# Patient Record
Sex: Female | Born: 1945
Health system: Southern US, Community
[De-identification: ages and names within clinical notes are randomized; demographics above are authoritative.]

---

## 2000-01-13 ENCOUNTER — Other Ambulatory Visit: Admission: RE | Admit: 2000-01-13 | Discharge: 2000-01-13 | Payer: Self-pay | Admitting: Obstetrics and Gynecology

## 2000-02-03 ENCOUNTER — Other Ambulatory Visit: Admission: RE | Admit: 2000-02-03 | Discharge: 2000-02-03 | Payer: Self-pay | Admitting: Obstetrics and Gynecology

## 2000-02-03 ENCOUNTER — Encounter (INDEPENDENT_AMBULATORY_CARE_PROVIDER_SITE_OTHER): Payer: Self-pay

## 2001-05-08 ENCOUNTER — Other Ambulatory Visit: Admission: RE | Admit: 2001-05-08 | Discharge: 2001-05-08 | Payer: Self-pay | Admitting: Obstetrics and Gynecology

## 2001-05-08 ENCOUNTER — Encounter: Payer: Self-pay | Admitting: Internal Medicine

## 2001-05-08 ENCOUNTER — Ambulatory Visit (HOSPITAL_COMMUNITY): Admission: RE | Admit: 2001-05-08 | Discharge: 2001-05-08 | Payer: Self-pay | Admitting: Internal Medicine

## 2008-07-09 ENCOUNTER — Ambulatory Visit: Payer: Self-pay | Admitting: Internal Medicine

## 2008-07-21 ENCOUNTER — Ambulatory Visit: Payer: Self-pay | Admitting: Internal Medicine

## 2009-07-21 ENCOUNTER — Ambulatory Visit (HOSPITAL_COMMUNITY): Admission: RE | Admit: 2009-07-21 | Discharge: 2009-07-21 | Payer: Self-pay | Admitting: Internal Medicine

## 2011-03-29 ENCOUNTER — Other Ambulatory Visit: Payer: Self-pay | Admitting: Internal Medicine

## 2011-03-29 DIAGNOSIS — Z1231 Encounter for screening mammogram for malignant neoplasm of breast: Secondary | ICD-10-CM

## 2011-04-19 ENCOUNTER — Ambulatory Visit: Payer: Self-pay

## 2014-06-05 DIAGNOSIS — Z1231 Encounter for screening mammogram for malignant neoplasm of breast: Secondary | ICD-10-CM | POA: Diagnosis not present

## 2014-12-03 DIAGNOSIS — Z0000 Encounter for general adult medical examination without abnormal findings: Secondary | ICD-10-CM | POA: Diagnosis not present

## 2014-12-03 DIAGNOSIS — R5383 Other fatigue: Secondary | ICD-10-CM | POA: Diagnosis not present

## 2014-12-03 DIAGNOSIS — R413 Other amnesia: Secondary | ICD-10-CM | POA: Diagnosis not present

## 2014-12-03 DIAGNOSIS — Z23 Encounter for immunization: Secondary | ICD-10-CM | POA: Diagnosis not present

## 2014-12-11 ENCOUNTER — Other Ambulatory Visit: Payer: Self-pay | Admitting: Internal Medicine

## 2014-12-11 ENCOUNTER — Other Ambulatory Visit: Payer: Self-pay | Admitting: Nurse Practitioner

## 2014-12-11 DIAGNOSIS — E049 Nontoxic goiter, unspecified: Secondary | ICD-10-CM

## 2014-12-15 ENCOUNTER — Other Ambulatory Visit: Payer: Self-pay | Admitting: Nurse Practitioner

## 2014-12-15 DIAGNOSIS — E049 Nontoxic goiter, unspecified: Secondary | ICD-10-CM

## 2014-12-16 ENCOUNTER — Ambulatory Visit
Admission: RE | Admit: 2014-12-16 | Discharge: 2014-12-16 | Disposition: A | Discharge status: Home / Self Care | Payer: Medicare Other | Source: Ambulatory Visit | Attending: Internal Medicine | Admitting: Internal Medicine

## 2014-12-16 DIAGNOSIS — E049 Nontoxic goiter, unspecified: Secondary | ICD-10-CM

## 2014-12-16 DIAGNOSIS — E042 Nontoxic multinodular goiter: Secondary | ICD-10-CM | POA: Diagnosis not present

## 2015-03-23 DIAGNOSIS — Z23 Encounter for immunization: Secondary | ICD-10-CM | POA: Diagnosis not present

## 2015-04-13 DIAGNOSIS — E049 Nontoxic goiter, unspecified: Secondary | ICD-10-CM | POA: Diagnosis not present

## 2015-04-14 ENCOUNTER — Other Ambulatory Visit: Payer: Self-pay | Admitting: Endocrinology

## 2015-04-14 DIAGNOSIS — E041 Nontoxic single thyroid nodule: Secondary | ICD-10-CM

## 2015-05-21 DIAGNOSIS — H25013 Cortical age-related cataract, bilateral: Secondary | ICD-10-CM | POA: Diagnosis not present

## 2015-06-04 ENCOUNTER — Other Ambulatory Visit (HOSPITAL_COMMUNITY)
Admission: RE | Admit: 2015-06-04 | Discharge: 2015-06-04 | Disposition: A | Discharge status: Home / Self Care | Payer: Medicare Other | Source: Ambulatory Visit | Attending: Interventional Radiology | Admitting: Interventional Radiology

## 2015-06-04 ENCOUNTER — Other Ambulatory Visit: Payer: Medicare Other

## 2015-06-04 ENCOUNTER — Ambulatory Visit
Admission: RE | Admit: 2015-06-04 | Discharge: 2015-06-04 | Disposition: A | Discharge status: Home / Self Care | Payer: Medicare Other | Source: Ambulatory Visit | Attending: Endocrinology | Admitting: Endocrinology

## 2015-06-04 DIAGNOSIS — E041 Nontoxic single thyroid nodule: Secondary | ICD-10-CM

## 2015-06-04 DIAGNOSIS — E042 Nontoxic multinodular goiter: Secondary | ICD-10-CM | POA: Insufficient documentation

## 2015-07-24 ENCOUNTER — Encounter: Payer: Self-pay | Admitting: Gastroenterology

## 2015-09-14 ENCOUNTER — Encounter: Payer: Self-pay | Admitting: Internal Medicine

## 2015-10-05 DIAGNOSIS — E049 Nontoxic goiter, unspecified: Secondary | ICD-10-CM | POA: Diagnosis not present

## 2015-10-12 DIAGNOSIS — E049 Nontoxic goiter, unspecified: Secondary | ICD-10-CM | POA: Diagnosis not present

## 2016-02-11 DIAGNOSIS — Z23 Encounter for immunization: Secondary | ICD-10-CM | POA: Diagnosis not present

## 2016-09-08 DIAGNOSIS — Z6823 Body mass index (BMI) 23.0-23.9, adult: Secondary | ICD-10-CM | POA: Diagnosis not present

## 2016-09-08 DIAGNOSIS — Z0000 Encounter for general adult medical examination without abnormal findings: Secondary | ICD-10-CM | POA: Diagnosis not present

## 2016-09-08 DIAGNOSIS — R69 Illness, unspecified: Secondary | ICD-10-CM | POA: Diagnosis not present

## 2016-09-08 DIAGNOSIS — E049 Nontoxic goiter, unspecified: Secondary | ICD-10-CM | POA: Diagnosis not present

## 2016-10-11 ENCOUNTER — Other Ambulatory Visit: Payer: Self-pay | Admitting: Endocrinology

## 2016-10-11 DIAGNOSIS — E049 Nontoxic goiter, unspecified: Secondary | ICD-10-CM | POA: Diagnosis not present

## 2016-11-18 ENCOUNTER — Other Ambulatory Visit: Payer: Medicare Other

## 2016-11-21 ENCOUNTER — Other Ambulatory Visit: Payer: Self-pay | Admitting: Internal Medicine

## 2016-11-21 DIAGNOSIS — R413 Other amnesia: Secondary | ICD-10-CM | POA: Diagnosis not present

## 2016-11-21 DIAGNOSIS — Z1231 Encounter for screening mammogram for malignant neoplasm of breast: Secondary | ICD-10-CM

## 2016-11-21 DIAGNOSIS — Z0000 Encounter for general adult medical examination without abnormal findings: Secondary | ICD-10-CM | POA: Diagnosis not present

## 2016-11-21 DIAGNOSIS — Z1211 Encounter for screening for malignant neoplasm of colon: Secondary | ICD-10-CM | POA: Diagnosis not present

## 2016-11-21 DIAGNOSIS — Z1389 Encounter for screening for other disorder: Secondary | ICD-10-CM | POA: Diagnosis not present

## 2016-11-22 ENCOUNTER — Ambulatory Visit
Admission: RE | Admit: 2016-11-22 | Discharge: 2016-11-22 | Disposition: A | Discharge status: Home / Self Care | Payer: Medicare HMO | Source: Ambulatory Visit | Attending: Internal Medicine | Admitting: Internal Medicine

## 2016-11-22 ENCOUNTER — Ambulatory Visit
Admission: RE | Admit: 2016-11-22 | Discharge: 2016-11-22 | Disposition: A | Discharge status: Home / Self Care | Payer: Medicare HMO | Source: Ambulatory Visit | Attending: Endocrinology | Admitting: Endocrinology

## 2016-11-22 DIAGNOSIS — E042 Nontoxic multinodular goiter: Secondary | ICD-10-CM | POA: Diagnosis not present

## 2016-11-22 DIAGNOSIS — Z1231 Encounter for screening mammogram for malignant neoplasm of breast: Secondary | ICD-10-CM | POA: Diagnosis not present

## 2016-11-22 DIAGNOSIS — E049 Nontoxic goiter, unspecified: Secondary | ICD-10-CM

## 2016-11-24 IMAGING — US US THYROID BIOPSY
1 series · 13 of 24 positions shown · non-contrast
Comparison: None.

CLINICAL DATA: Bilateral lower pole dominant thyroid nodules

EXAM:
ULTRASOUND GUIDED NEEDLE ASPIRATE BIOPSY OF THE THYROID GLAND

[Series 1: us thyroid biopsy · 0.07mm/px · 24 acquisitions, 13 frames shown]
[im 1/24]
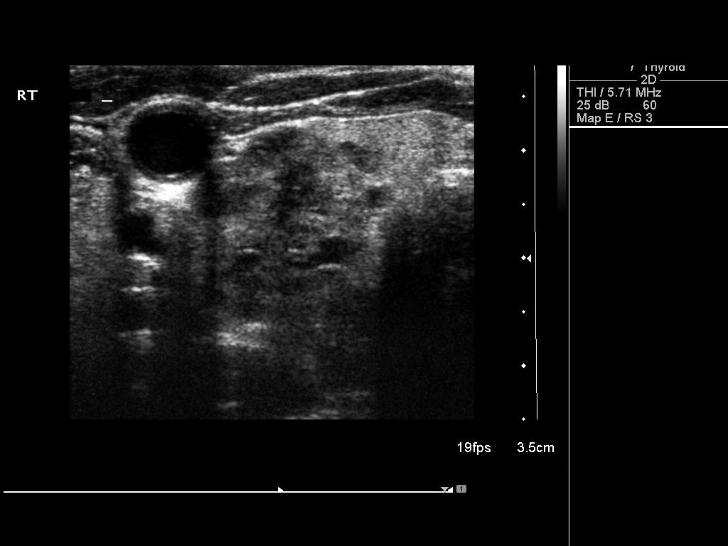
[im 3/24]
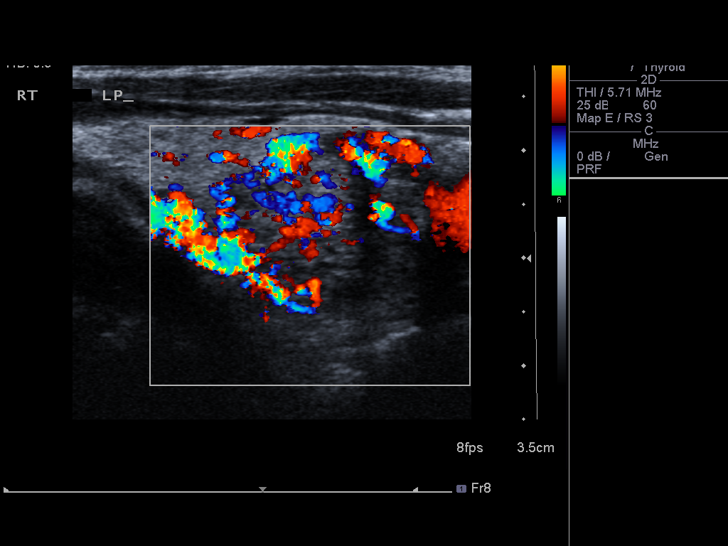
[im 5/24]
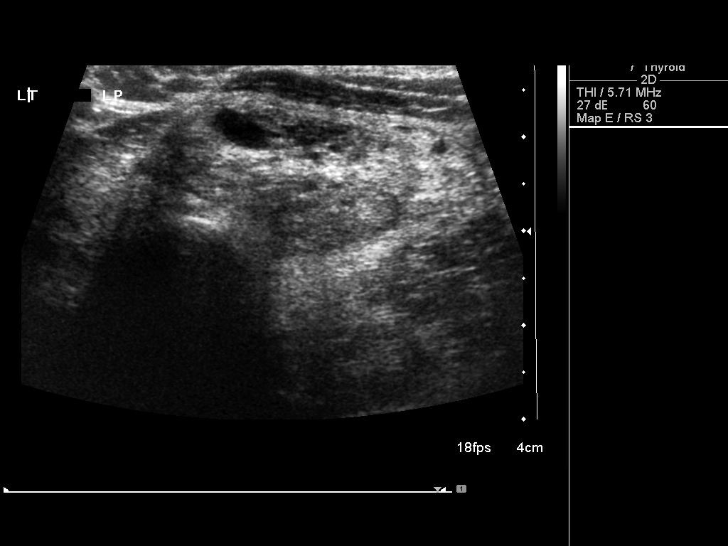
[im 7/24]
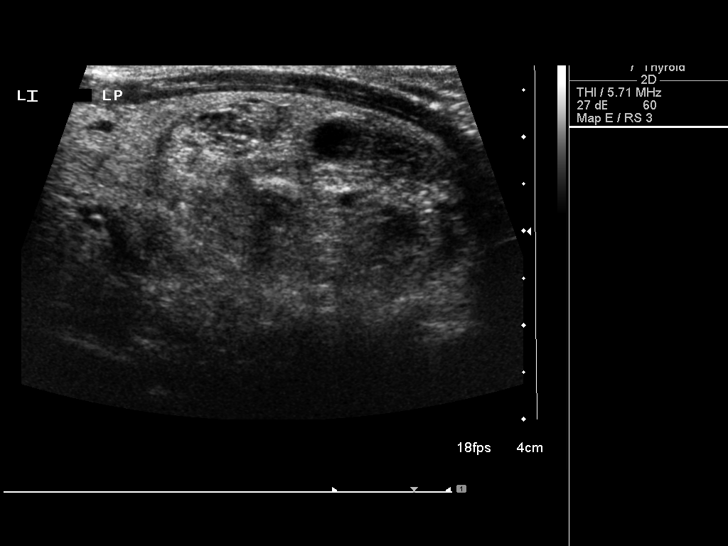
[im 9/24]
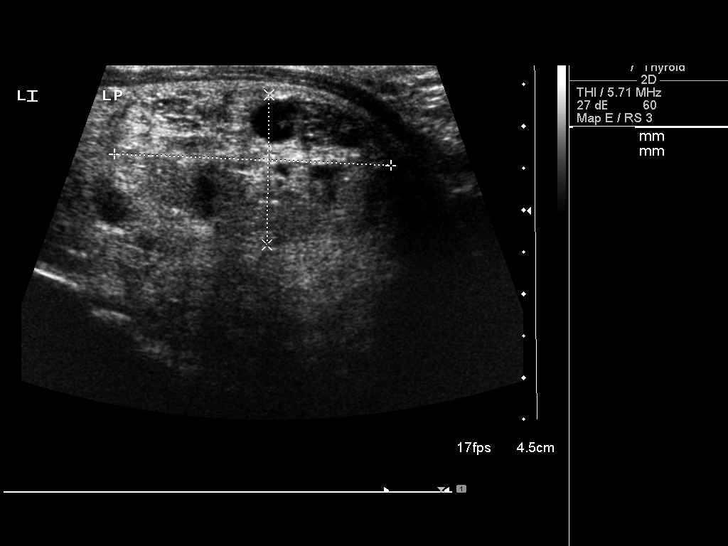
[im 11/24]
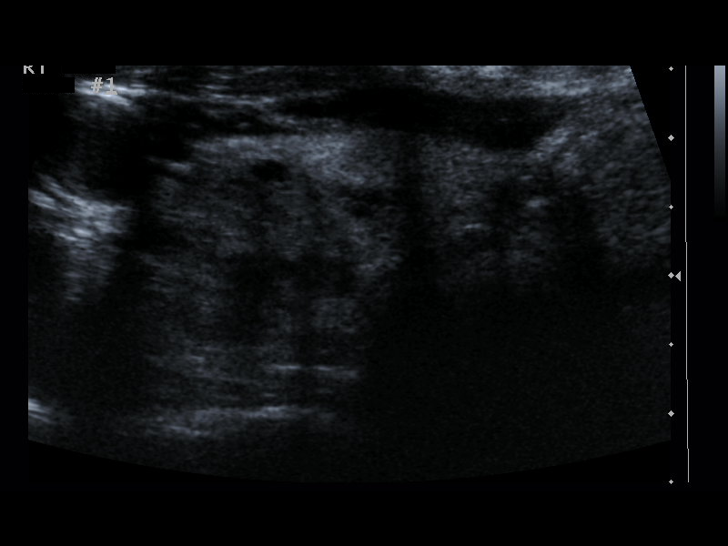
[im 13/24]
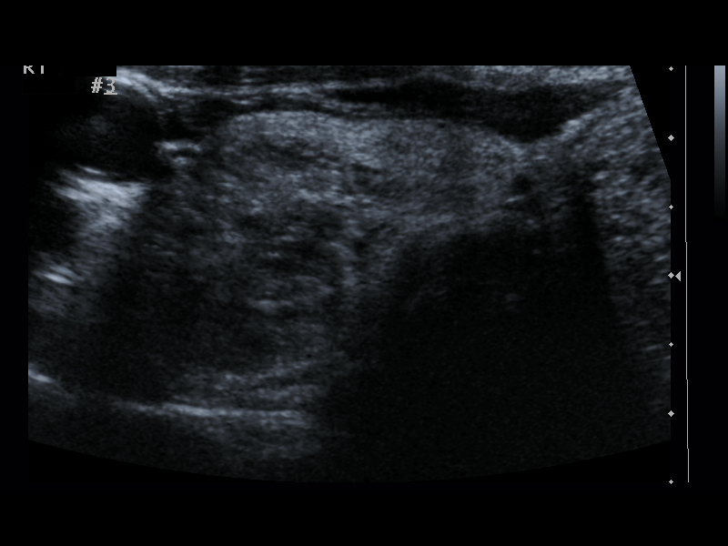
[im 14/24]
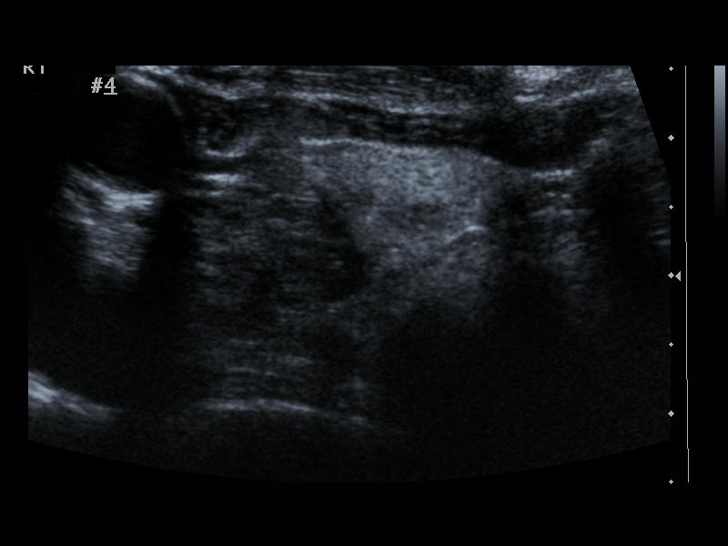
[im 16/24]
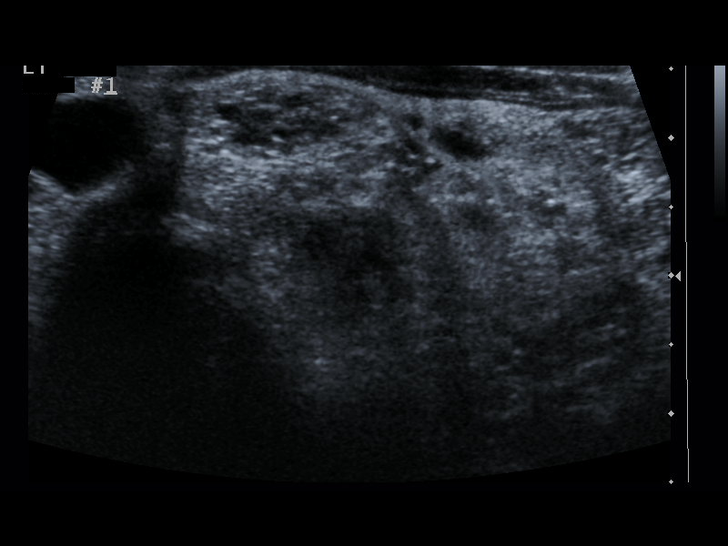
[im 18/24]
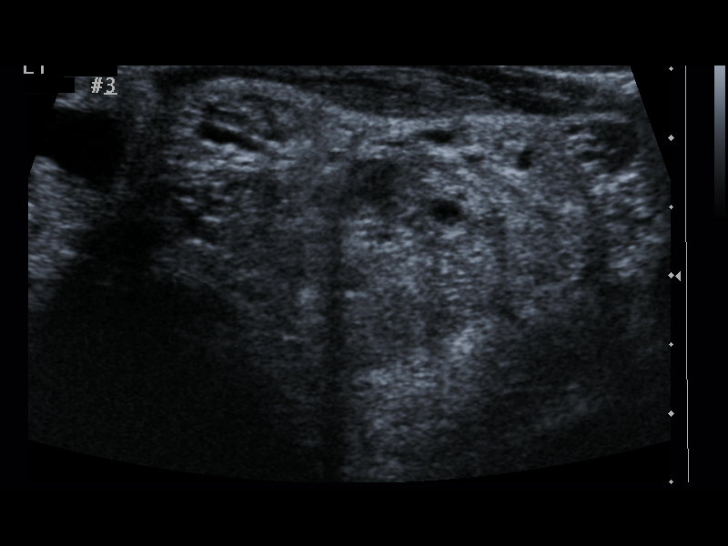
[im 20/24]
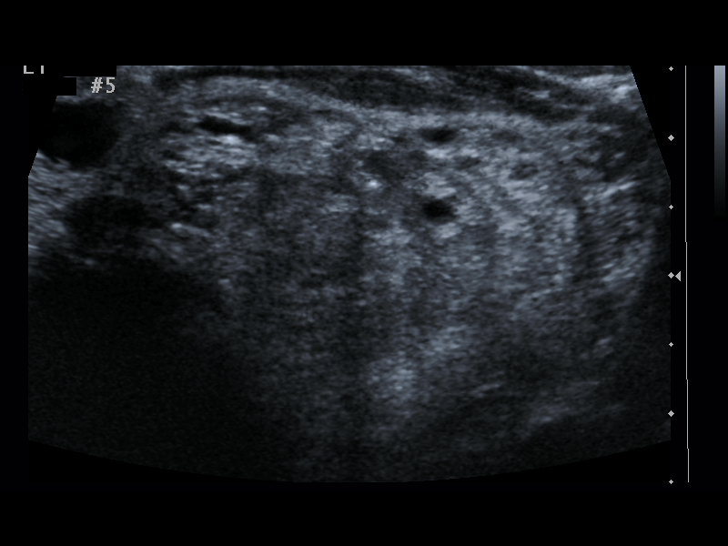
[im 22/24]
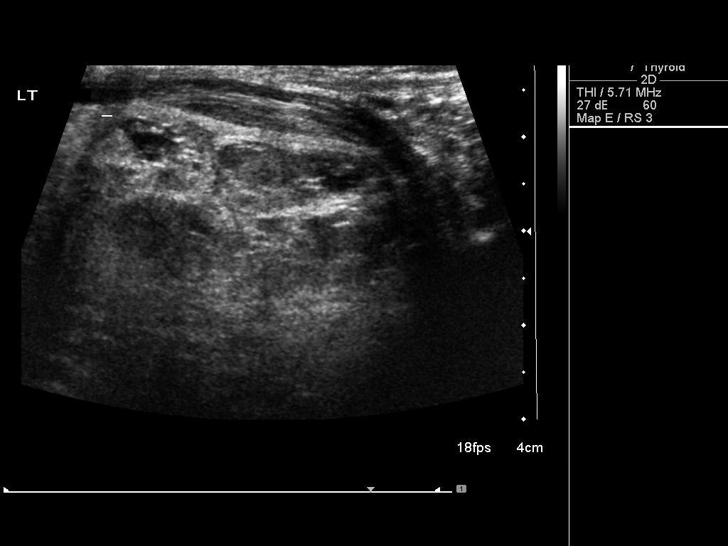
[im 24/24]
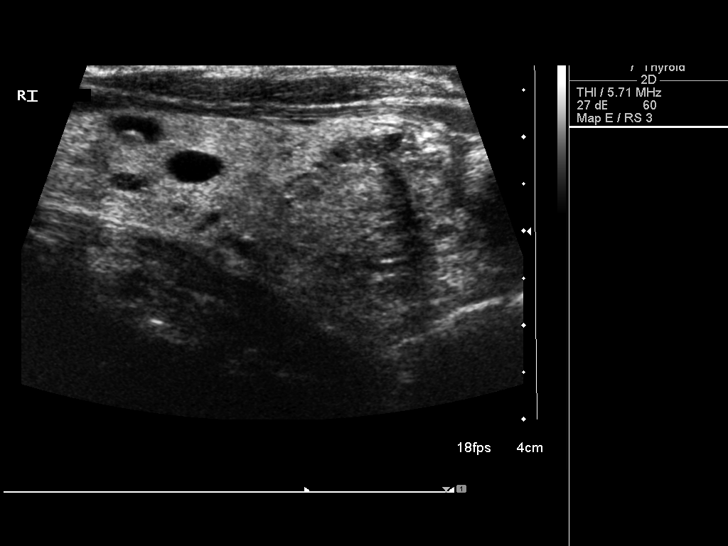

[13 of 24 positions shown; findings below may reference images not displayed]

PROCEDURE:
Thyroid biopsy was thoroughly discussed with the patient and
questions were answered. The benefits, risks, alternatives, and
complications were also discussed. The patient understands and
wishes to proceed with the procedure. Written consent was obtained.

Ultrasound was performed to localize and mark an adequate site for
the biopsy. The patient was then prepped and draped in a normal
sterile fashion. Local anesthesia was provided with 1% lidocaine.
Using direct ultrasound guidance, 5 passes were made using needles
into the nodule within the right lobe of the thyroid. Subsequently
cuff using direct ultrasound guidance, 5 passes were made using
needles into the nodule within the left lobe of the thyroid.
Ultrasound was used to confirm needle placements on all occasions.
Specimens were sent to Pathology for analysis.

COMPLICATIONS:
None.
FINDINGS: Images document needle placement in the bilateral lower pole the
thyroid nodules.
IMPRESSION: Ultrasound guided needle aspirate biopsy performed of the bilateral
dominant thyroid nodules.

## 2016-12-08 DIAGNOSIS — R69 Illness, unspecified: Secondary | ICD-10-CM | POA: Diagnosis not present

## 2016-12-23 ENCOUNTER — Encounter: Payer: Self-pay | Admitting: Psychology

## 2017-01-02 ENCOUNTER — Encounter: Payer: Medicare HMO | Admitting: Psychology

## 2017-02-23 ENCOUNTER — Ambulatory Visit (INDEPENDENT_AMBULATORY_CARE_PROVIDER_SITE_OTHER): Payer: Medicare HMO | Admitting: Psychology

## 2017-02-23 ENCOUNTER — Encounter: Payer: Self-pay | Admitting: Psychology

## 2017-02-23 DIAGNOSIS — R413 Other amnesia: Secondary | ICD-10-CM

## 2017-02-23 NOTE — Progress Notes (Signed)
   Neuropsychology Note  Angel Adkins completed 1 hour of neuropsychological testing with technician, Wallace Keller, BS, under the supervision of Dr. Elvis Coil. The patient was anxious and stated it was difficult for her to concentrate due to not taking her vitamins and also not eating this morning. A snack was offered and patient was able to proceed with the  testing. Angel Adkins will return within 2 weeks for a feedback session with Dr. Alinda Dooms at which time her test performances, clinical impressions and treatment recommendations will be reviewed in detail. The patient understands she can contact our office should she require our assistance before this time.  Full report to follow.

## 2017-02-23 NOTE — Progress Notes (Signed)
NEUROPSYCHOLOGICAL INTERVIEW (CPT: [id number])  Name: Angel Adkins Date of Birth: June 16, 1945 Date of Interview: 02/23/2017  Reason for Referral:  Angel Adkins is a 71 y.o. female who is referred for neuropsychological evaluation by Arnette Felts, FNP, of [facility] due to concerns about memory loss. This patient is accompanied in the office by her her daughter, Angel Adkins, who supplements the history.  History of Presenting Problem:  Angel Adkins was seen by her primary care provider on 11/21/2016 for annual visit and to address memory concerns. She scored 28/30 on the SLUMS (normal). She reported confusion, challenges with driving directions and family concerns about withdrawing large amounts of money with nothing to show for it. Labwork did not reveal any reversible cause of memory loss. B12 was actually quite high, as the patient takes a vitamin B complex supplement.   At today's visit (02/23/2017), the patient endorses memory concerns, with gradual onset in the past year. She reports progressive worsening. Her daughter agrees with this timeframe. She notes that they were on a cruise together in March 2017 and the patient demonstrated very mild memory issues, such as forgetting where she put something, but was able to recall what they had done the day before, etc. Then around Christmas 2017, her daughter observed more decline. The patient was missing bill payments and making late payments, which was very uncharacteristic for her. She also was letting someone borrow money from her "constantly", despite having conversations with her daughter that she was going to stop lending money to the person. Her daughter became very concerned around May 2018 and brought her mother back to [place] to live with her at that time.   Angel Adkins is no longer driving. Her daughter is now managing her finances and appointments. The patient is not on any medications. She is in good health. She is doing  water aerobics once a week. She is able to help with housekeeping tasks and prepare simple meals.  Upon direct questioning, the patient/caregiver reported:  Forgetting recent conversations/events: Yes, and long term memory also more vague than before Misplacing/losing items: Yes Difficulty concentrating: No Starting but not finishing tasks: No Distracted easily: No Processing information more slowly: Yes, and more confusion Word-finding difficulty: Yes Word substitutions: No Writing difficulty: No Spelling difficulty: No Comprehension difficulty: No  There is family history of dementia in the patient's mother who is still living (in a nursing home in [place]) and is age 21. It sounds as though she is in the severe stage of dementia at this time, non communicative. She reported that her mother began having memory difficulties in her early 35s.  The patient denies any sleep difficulty. She has a variable sleep schedule but gets enough sleep. She enjoys doing word puzzles during the day. She and her daughter are going to look into a senior center when they get back to [place].  When the patient was living alone, it appears she was eating less. She had lost quite a bit of weight. However, since living with her daughter, she is eating much more. She frequently states she is "starving" even though they just ate an hour prior. She has returned to her natural weight.  There have been no behavioral problems, paranoid ideation Adkins hallucinations. She describes her mood as content, not depressed. She was demonstrating increased anxiety before moving to [place] but this seems to have resolved.    Social History: Born/Raised: [place]  Education: Associates degree in [place] Adkins education Occupational history:  Retired Research scientist (medical)  Marital history: Never married, 1 daughter, 2 grandkids (20, 20) also live in the home Alcohol: Not really, every now and then a little wine Tobacco: Occasional smoker >20  years ago SA: Denied   Medical History: No past medical history on file.   Current Medications:  No outpatient encounter prescriptions on file as of 02/23/2017.   No facility-administered encounter medications on file as of 02/23/2017.     Behavioral Observations:   Appearance: Neatly and appropriately dressed and groomed Gait: Ambulated independently, no gross abnormalities observed Speech: Fluent; normal rate, rhythm and volume. Mild to moderate word finding difficulty. Thought process: Linear, goal directed Affect: Full, euthymic Interpersonal: Pleasant, appropriate   TESTING: There is medical necessity to proceed with neuropsychological assessment as the results will be used to aid in differential diagnosis and clinical decision-making and to inform specific treatment recommendations. Per the patient, her daughter and medical records reviewed, there has been a change in cognitive functioning and a reasonable suspicion of dementia (rule out AD).  Following the clinical interview, the patient completed a full battery of neuropsychological testing with my psychometrician under my supervision.   PLAN: The patient will return to see me for a follow-up session at which time her test performances and my impressions and treatment recommendations will be reviewed in detail.  Full report to follow.

## 2017-02-24 DIAGNOSIS — E2839 Other primary ovarian failure: Secondary | ICD-10-CM | POA: Diagnosis not present

## 2017-02-24 DIAGNOSIS — R413 Other amnesia: Secondary | ICD-10-CM | POA: Diagnosis not present

## 2017-02-24 DIAGNOSIS — Z23 Encounter for immunization: Secondary | ICD-10-CM | POA: Diagnosis not present

## 2017-03-01 DIAGNOSIS — R69 Illness, unspecified: Secondary | ICD-10-CM | POA: Diagnosis not present

## 2017-03-03 ENCOUNTER — Other Ambulatory Visit: Payer: Self-pay | Admitting: Internal Medicine

## 2017-03-03 DIAGNOSIS — E2839 Other primary ovarian failure: Secondary | ICD-10-CM

## 2017-03-23 NOTE — Progress Notes (Signed)
NEUROPSYCHOLOGICAL EVALUATION   Name:    Angel Adkins  Date of Birth:   01-16-1946 Date of Interview:  02/23/2017 Date of Testing:  02/23/2017   Date of Feedback:  03/27/2017       Background Information:  Reason for Referral:  Angel Adkins is a 71 y.o. female referred by Minette Brine, FNP, or [facility] to assess her current level of cognitive functioning and assist in differential diagnosis. The current evaluation consisted of a review of available medical records, an interview with the patient and her daughter Vicie Mutters, and the completion of a neuropsychological testing battery. Informed consent was obtained.  History of Presenting Problem:  Angel Adkins was seen by her primary care provider on 11/21/2016 for annual visit and to address memory concerns. She scored 28/30 on the SLUMS (normal). She reported confusion, challenges with driving directions and family concerns about withdrawing large amounts of money with nothing to show for it. Labwork did not reveal any reversible cause of memory loss. B12 was actually quite high, as the patient takes a vitamin B complex supplement.   At today's visit (02/23/2017), the patient endorses memory concerns, with gradual onset in the past year. She reports progressive worsening. Her daughter agrees with this timeframe. She notes that they were on a cruise together in March 2017 and the patient demonstrated very mild memory issues, such as forgetting where she put something, but was able to recall what they had done the day before, etc. Then around Christmas 2017, her daughter observed more decline. The patient was missing bill payments and making late payments, which was very uncharacteristic for her. She also was letting someone borrow money from her "constantly", despite having conversations with her daughter that she was going to stop lending money to the person. Her daughter became very concerned around May 2018 and brought her  mother back to [place] to live with her at that time.   Angel Adkins is no longer driving. Her daughter is now managing her finances and appointments. The patient is not on any medications. She is in good health. She is doing water aerobics once a week. She is able to help with housekeeping tasks and prepare simple meals.  Upon direct questioning, the patient/caregiver reported:  Forgetting recent conversations/events: Yes, and long term memory also more vague than before Misplacing/losing items: Yes Difficulty concentrating: No Starting but not finishing tasks: No Distracted easily: No Processing information more slowly: Yes, and more confusion Word-finding difficulty: Yes Word substitutions: No Writing difficulty: No Spelling difficulty: No Comprehension difficulty: No  There is family history of dementia in the patient's mother who is still living (in a nursing home in [place]) and is age 10. It sounds as though she is in the severe stage of dementia at this time, non communicative. She reported that her mother began having memory difficulties in her early 32s.  The patient denies any sleep difficulty. She has a variable sleep schedule but gets enough sleep. She enjoys doing word puzzles during the day. She and her daughter are going to look into a senior center when they get back to [place].  When the patient was living alone, it appears she was eating less. She had lost quite a bit of weight. However, since living with her daughter, she is eating much more. She frequently states she is "starving" even though they just ate an hour prior. She has returned to her natural weight.  There have been no behavioral problems,  paranoid ideation or hallucinations. She describes her mood as content, not depressed. She was demonstrating increased anxiety before moving to [place] but this seems to have resolved.   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  Current medications:  No outpatient encounter prescriptions on file as of 03/27/2017.   No facility-administered encounter medications on file as of 03/27/2017.      Current Examination:  Behavioral Observations:  Appearance: Neatly and appropriately dressed and groomed Gait: Ambulated independently, no gross abnormalities observed Speech: Fluent; normal rate, rhythm and volume. Mild to moderate word finding difficulty. Thought process: Linear, goal directed Affect: Full, euthymic during the interview. Quite anxious and fidgety during the testing session. Interpersonal: Pleasant, appropriate. The patient required a high level of encouragement to complete the full testing session.   Orientation: Oriented to person (name and DOB), current month/year and current city. Disoriented to current age, date, and day of the week. Unable to name the current President or his predecessor.  Tests Administered: . Test of Premorbid Functioning (TOPF) . Wechsler Adult Intelligence Scale-Fourth Edition (WAIS-IV): Similarities and Digit Span subtests . [employer] Verbal Learning Test - 2nd Edition (CVLT-2) Short Form . Repeatable Battery for the Assessment of Neuropsychological Status (RBANS) Form A:  Story Memory and Story Recall subtests, Figure Copy and Recall subtests, and Coding subtest  . Neuropsychological Assessment Battery (NAB) Language Module, Form 1: Naming subtest . [place] Diagnostic Aphasia Examination: Complex Ideational Material subtest . Controlled Oral Word Association Test (COWAT) . Trail Making Test A and B . Clock drawing test . Geriatric Depression Scale (GDS) 15  Item . Generalized Anxiety Disorder - 7 item screener (GAD-7)  Test Results: Note: Standardized scores are presented only for use by appropriately trained professionals and to allow for any future test-retest comparison. These scores should not be interpreted without consideration of all the information that is contained in the rest of the report. The most recent standardization samples from the test publisher or other sources were used whenever possible to derive standard scores; scores were corrected for age, gender, ethnicity and education when available.   Test Scores:  Test Name Raw Score Standardized Score Descriptor  TOPF 43/70 SS= 101 Average  WAIS-IV Subtests     Similarities 13/36 ss= 5 Borderline  Digit Span Forward 13/16 ss= 14 Superior  Digit Span Backward 8/16 ss= 10 Average  RBANS Subtests     Story Memory 8/24 Z= -2.6 Severely impaired  Story Recall 0/12 Z= -4.1 Severely impaired  Figure Copy 14/20 Z= -2.1 Impaired  Figure Recall 0/20 Z= -3 Severely impaired  Coding 24/89 Z= -1.9 Borderline  CVLT-II Scores     Trial 1 3/9 Z= -2.5 Impaired  Trial 4 5/9 Z= -2 Impaired  Trials 1-4 total 15/36 T= 24 Severely impaired  SD Free Recall 1/9 Z= -2 Impaired  LD Free Recall 0/9 Z= -2 Impaired  LD Cued Recall 0/9 Z= -3 Severely impaired  Recognition Discriminability 7/9 hits, 12 false positives Z= -2.5 Impaired  Forced Choice Recognition 4/9  Severely Impaired  NAB Language-Naming 21/31 T= 19 Severely impaired  BDAE Complex Ideational Material 10/12  Below expectation  COWAT-FAS 25 T= 36 Borderline  COWAT-Animals  10 T= 30 Impaired  Trail Making Test A  24" 0 errors T= 62 High average  Trail Making Test B  Pt unable   Impaired  Clock Drawing   Impaired  GDS-15 1/15  WNL  GAD-7 0/21  WNL      Description of Test Results:  Premorbid verbal intellectual abilities were estimated to have been within the average range based on a test of word reading. Psychomotor  processing speed was variable, ranging from borderline to high average. Basic auditory attention was superior and more complex auditory attention (ie working memory) was average. Visual-spatial construction was impaired on her drawn copy of a complex geometric figure. She demonstrated appreciation of the gestalt but some details were omitted. Language abilities were impaired. Specifically, confrontation naming was severely impaired, and semantic verbal fluency was impaired. Auditory comprehension of complex ideational material was mildly below expectation. With regard to verbal memory, encoding and acquisition of non-contextual information (i.e., word list) was severely impaired. After a brief distracter task, free recall was impaired (1/9 items recalled). After a delay, free recall was impaired (0/9 items recalled). Cued recall was severely impaired (again 0/9 items recalled). Performance on a yes/no recognition task was severely impaired with a significant number of false positive responses. Performance on a forced choice recognition task also was severely impaired. On another verbal memory test, encoding and acquisition of contextual auditory information (i.e., short story repeated once) was severely impaired. After a delay, free recall was severely impaired (no aspects of the original story were recalled). With regard to non-verbal memory, delayed free recall of visual information was severely impaired (no aspects of the original figure were recalled). Executive functioning was impaired. Mental flexibility and set-shifting were severely impaired; she was unable to complete Tra[place] [address]. Verbal fluency with phonemic search restrictions was borderline impaired. Verbal abstract reasoning was borderline impaired. Performance on a clock drawing task was impaired. On self-report measures of mood, the patient's responses were not indicative of clinically significant depression or anxiety at the present time.    Clinical  Impressions: Mild to moderate dementia, most likely secondary to Alzheimer's disease, without behavioral disturbance. Results of cognitive testing revealed severe impairment in multiple domains (temporal orientation, memory encoding and consolidation, language, executive functioning). Additionally, there is evidence that the patient's cognitive deficits are interfering with her ability to manage complex ADLs (eg finances, appointments). As such, diagnostic criteria are met for a dementia syndrome.  Her cognitive profile is reflective of probable medial temporal lobe involvement as there were prominent deficits in her ability to learn and consolidation new information, as well as in semantic retrieval (confrontation naming, semantic fluency). Her cognitive profile, clinical features and course of symptoms are most consistent with Alzheimer's disease. She is likely in the mild to moderate stage of dementia. She is not demonstrating behavioral disturbance or mood disorder.    Recommendations/Plan: Based on the findings of the present evaluation, the following recommendations are offered:  1. The patient is an appropriate candidate for cholinesterase inhibitor therapy, from a neuropsychological perspective. She can follow up with her PCP about this. 2. She is commended for her regular physical activity (water aerobics) and it is recommended that she continue with this. Additionally, she would likely benefit from regular engagement in activities at a local senior center. This would provide her with a structured routine and increased socialization.  3. Due to her dementia and more specifically her profound memory deficits and decreased reasoning abilities, she is at increased risk of financial exploitation  and undue influence. I am glad that her daughter is helping her manage finances now.  4. The patient appears to have the appropriate level of care and support living with her daughter. I agree with the  patient's decision to stop driving, and I agree with her daughter managing appointments/finances. If the patient starts a medication at any point, her daughter should be in charge of managing/administering this. 5. The patient's daughter will benefit from education and support regarding Alzheimer's disease and dementia caregiving. She is referred to the Alzheimer[employer] ([email or web address]).    Feedback to Patient: KANOELANI DOBIES' daughter, Vicie Mutters, completed a feedback session with me on 03/27/2017 to review the results of her mother's neuropsychological evaluation. 20 minutes was spent reviewing her test results, my impressions and my recommendations as detailed above. I will also mail a copy of this report to the patient/daughter.   Total time spent on this patient's case: [id number] unit for interview with psychologist; [phone] units of testing by psychometrician under psychologist's supervision; [phone] units for medical record review, scoring of neuropsychological tests, interpretation of test results, preparation of this report, and review of results to the patient by psychologist.      Thank you for your referral of JACKLYN BRANAN. Please feel free to contact me if you have any questions or concerns regarding this report.

## 2017-03-27 ENCOUNTER — Encounter: Payer: Self-pay | Admitting: Psychology

## 2017-03-27 ENCOUNTER — Ambulatory Visit (INDEPENDENT_AMBULATORY_CARE_PROVIDER_SITE_OTHER): Payer: Medicare HMO | Admitting: Psychology

## 2017-03-27 DIAGNOSIS — F0280 Dementia in other diseases classified elsewhere without behavioral disturbance: Secondary | ICD-10-CM | POA: Diagnosis not present

## 2017-03-27 DIAGNOSIS — G301 Alzheimer's disease with late onset: Secondary | ICD-10-CM | POA: Diagnosis not present

## 2017-05-05 DIAGNOSIS — R413 Other amnesia: Secondary | ICD-10-CM | POA: Diagnosis not present

## 2017-05-15 DIAGNOSIS — G5760 Lesion of plantar nerve, unspecified lower limb: Secondary | ICD-10-CM | POA: Diagnosis not present

## 2017-05-15 DIAGNOSIS — M79606 Pain in leg, unspecified: Secondary | ICD-10-CM | POA: Diagnosis not present

## 2018-07-19 ENCOUNTER — Encounter: Payer: Self-pay | Admitting: Gastroenterology

## 2018-08-18 IMAGING — US US THYROID
1 series · 12 of 25 positions shown · non-contrast
Comparison: 12/16/2014

CLINICAL DATA: Goiter.

EXAM:
THYROID ULTRASOUND
TECHNIQUE: Ultrasound examination of the thyroid gland and adjacent soft
tissues was performed.

[Series 1: us thyroid · 0.04mm/px · 12 of 84 slices shown]
[im 4/84]
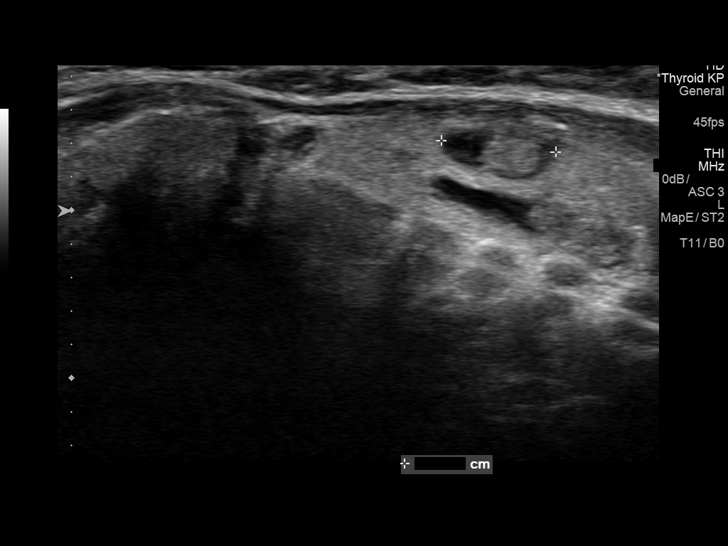
[im 11/84]
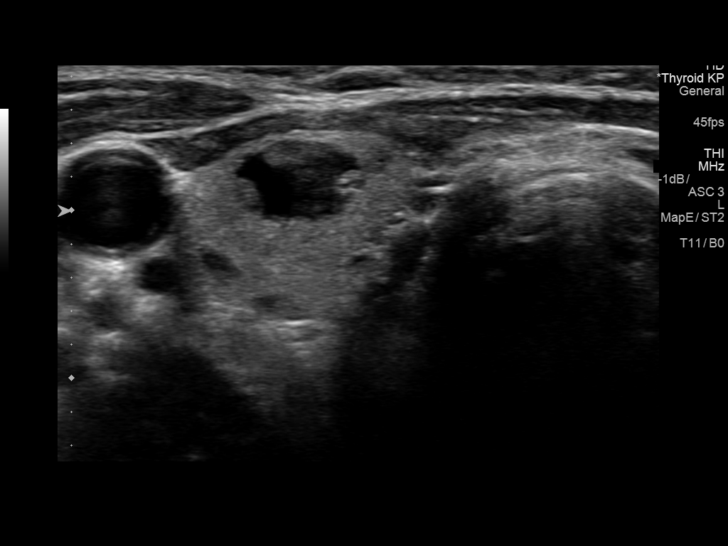
[im 18/84]
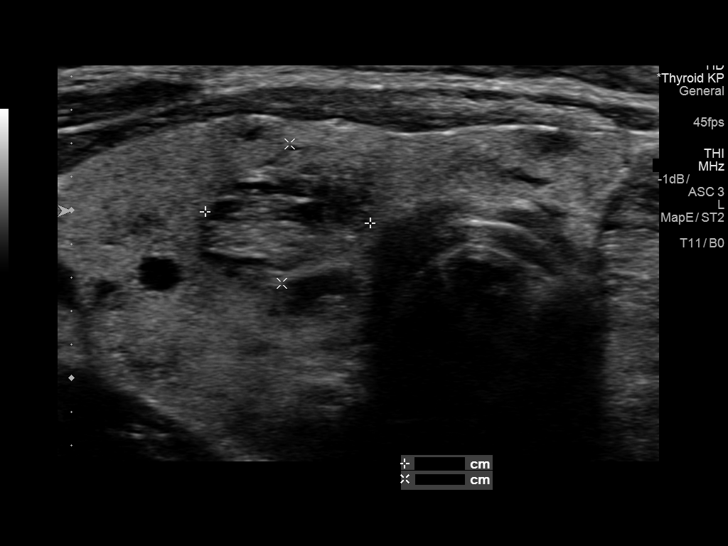
[im 25/84]
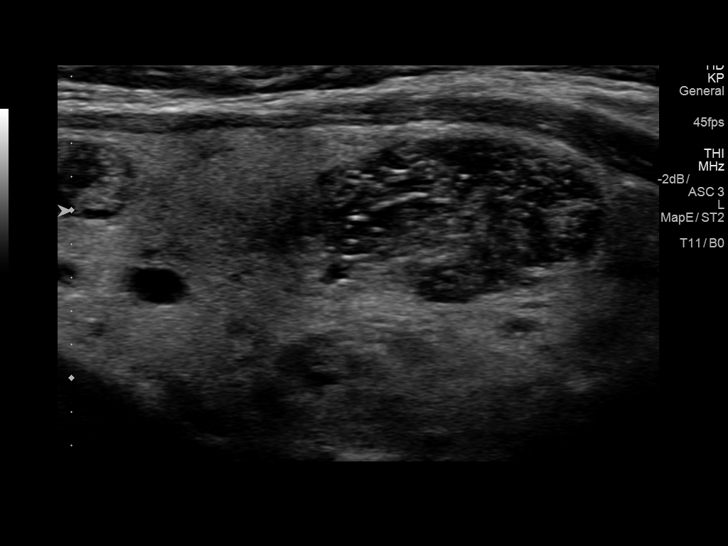
[im 32/84]
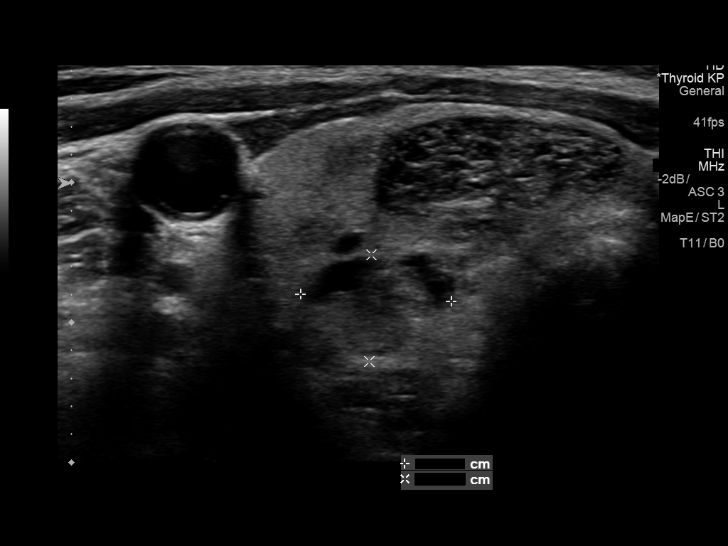
[im 39/84]
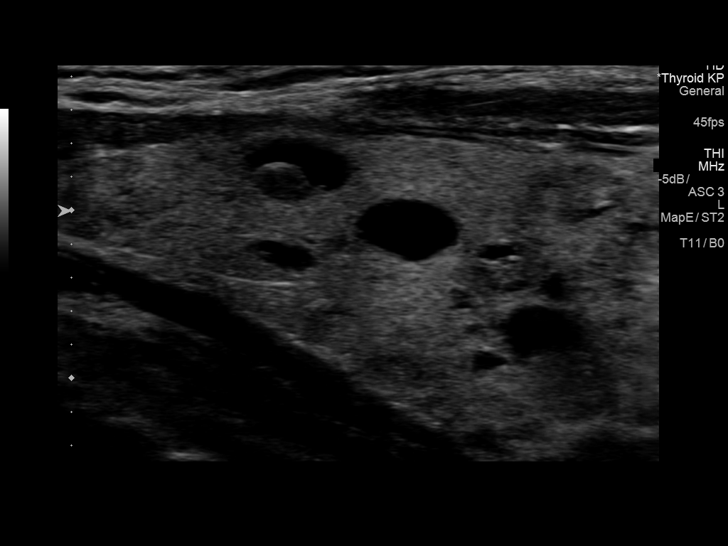
[im 45/84]
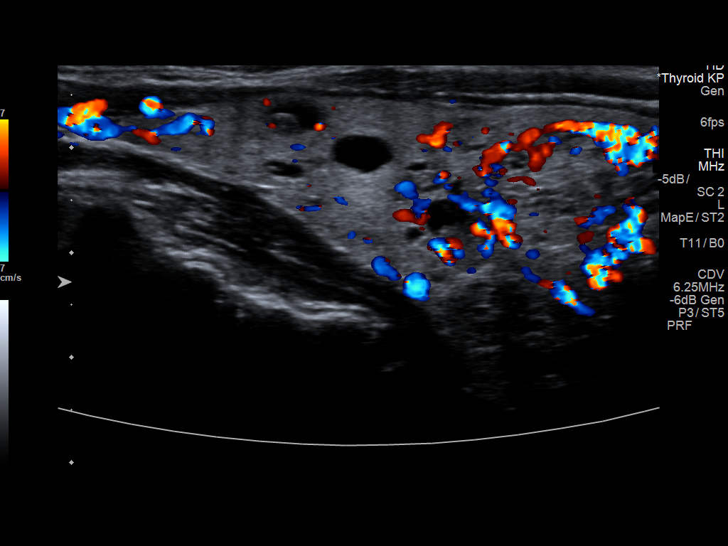
[im 52/84]
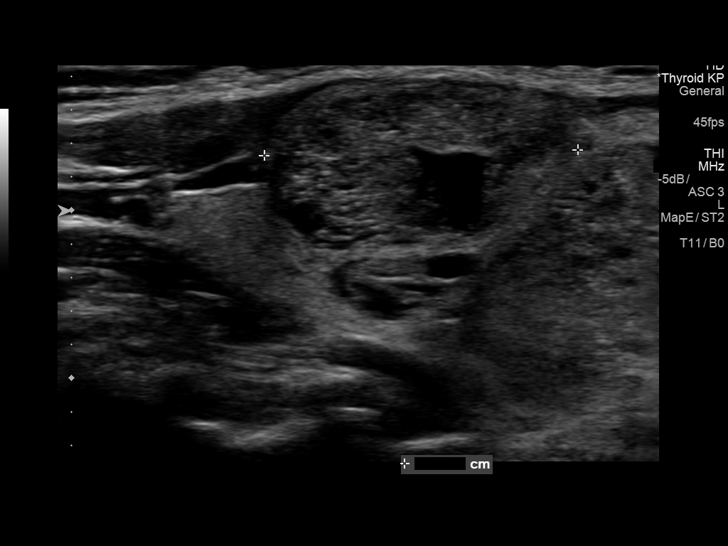
[im 59/84]
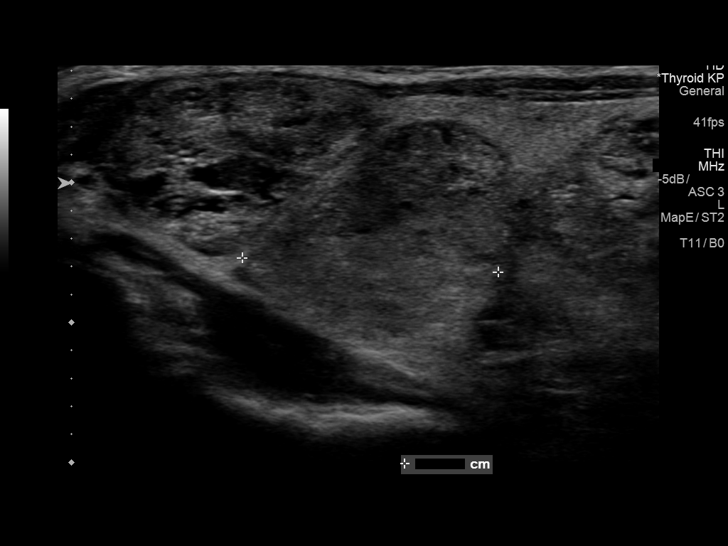
[im 66/84]
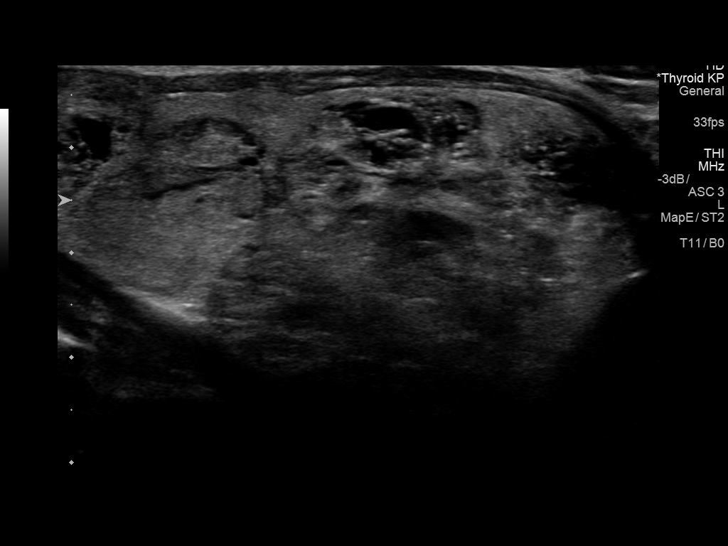
[im 73/84]
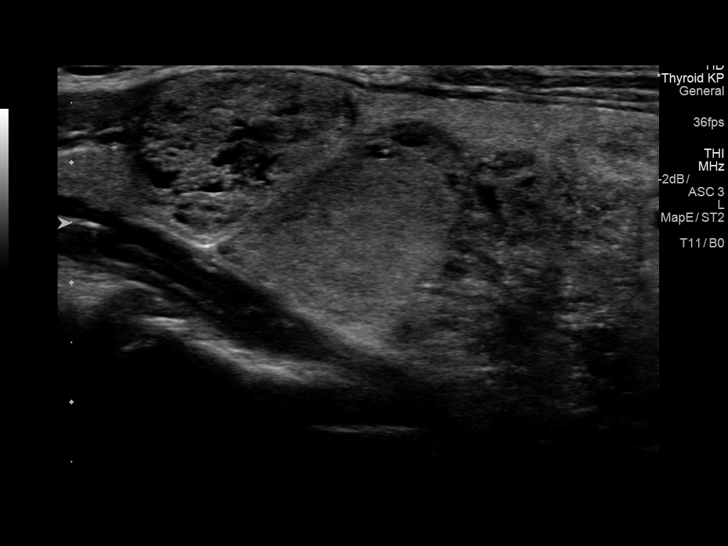
[im 80/84]
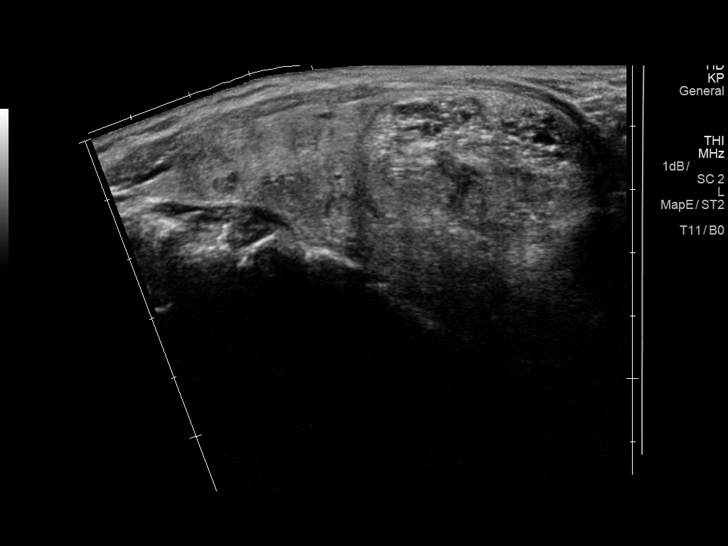

[12 of 25 positions shown; findings below may reference images not displayed]

FINDINGS: Parenchymal Echotexture: Markedly heterogenous

Isthmus: 0.6 cm, previously 0.5 cm

Right lobe: 5.3 x 2.3 x 2.0 cm, previously 5.3 x 2.5 x 2.2 cm

Left lobe: 6.8 x 3.4 x 2.8 cm, previously 6.9 x 3.6 x 2.6 cm

_________________________________________________________

Estimated total number of nodules >/= 1 cm: 6-10

Number of spongiform nodules >/=  2 cm not described below (TR1): 0

Number of mixed cystic and solid nodules >/= 1.5 cm not described
below (TR2): 0

_________________________________________________________

Nodule # 1:

Prior biopsy: No

Location: Right; Mid

Maximum size: 1.2 cm; Other 2 dimensions: 0.8 x 1.0 cm, previously,
0.8 cm

Composition: mixed cystic and solid (1)

Echogenicity: isoechoic (1)

Shape: not taller-than-wide (0)

Margins: smooth (0)

Echogenic foci: none (0)

ACR TI-RADS total points: 2.

ACR TI-RADS risk category:  TR2 (2 points).

Significant change in size (>/= 20% in two dimensions and minimal
increase of 2 mm): Yes

Change in features: No

Change in ACR TI-RADS risk category: No

ACR TI-RADS recommendations:

This nodule does NOT meet TI-RADS criteria for biopsy or dedicated
follow-up.

_________________________________________________________

Nodule # 2:

Prior biopsy: No

Location: Right; Mid

Maximum size: 1.9 cm; Other 2 dimensions: 1.7 x 0.9 cm, previously,
1.7 x 0.9 x 1.2 cm

Composition: mixed cystic and solid (1)

Echogenicity: isoechoic (1)

Shape: not taller-than-wide (0)

Margins: smooth (0)

Echogenic foci: none (0)

ACR TI-RADS total points: 2.

ACR TI-RADS risk category:  TR2 (2 points).

Significant change in size (>/= 20% in two dimensions and minimal
increase of 2 mm): No

Change in features: No

Change in ACR TI-RADS risk category: No

ACR TI-RADS recommendations:

This nodule does NOT meet TI-RADS criteria for biopsy or dedicated
follow-up.

_________________________________________________________

Nodule # 3:

Prior biopsy: No

Location: Left; Superior

Maximum size: 1.9 cm; Other 2 dimensions: 1.5 x 1.1 cm, previously,
1.5 x 0.9 x 1.3 cm

Composition: solid/almost completely solid (2)

Echogenicity: isoechoic (1)

Shape: not taller-than-wide (0)

Margins: smooth (0)

Echogenic foci: none (0)

ACR TI-RADS total points: 3.

ACR TI-RADS risk category:  TR3 (3 points).

Significant change in size (>/= 20% in two dimensions and minimal
increase of 2 mm): Yes

Change in features: No

Change in ACR TI-RADS risk category: No

ACR TI-RADS recommendations:

*Given size (>/= 1.5 - 2.4 cm) and appearance, a follow-up
ultrasound in 1 year should be considered based on TI-RADS criteria.

_________________________________________________________

Nodule # 4:

Prior biopsy: No

Location: Left; Mid

Maximum size: 2.3 cm; Other 2 dimensions: 1.6 x 1.8 cm, previously,
2.3 x 2.0 x 1.2 cm

Composition: solid/almost completely solid (2)

Echogenicity: hypoechoic (2)

Shape: not taller-than-wide (0)

Margins: smooth (0)

Echogenic foci: none (0)

ACR TI-RADS total points: 4.

ACR TI-RADS risk category:  TR4 (4-6 points).

Significant change in size (>/= 20% in two dimensions and minimal
increase of 2 mm): No

Change in features: No

Change in ACR TI-RADS risk category: No

ACR TI-RADS recommendations:

**Given size (>/= 1.5 cm) and appearance, fine needle aspiration of
this moderately suspicious nodule should be considered based on
TI-RADS criteria.

_________________________________________________________

2.3 x 1.5 x 1.7 cm heterogeneous and hypoechoic nodule in the right
lower pole previously measured 2.1 x 1.8 x 1.9 cm. This was
previously biopsied.

Dominant left lower pole nodule measuring 4.0 x 2.6 x 3.5 cm was
previously biopsied. Previously, this was measured as 2 separate
nodules and is not significantly changed in size or appearance.

Other smaller nodules do not meet criteria for follow-up or biopsy.
IMPRESSION: Nodules 1 and 2 do not meet criteria for follow-up or biopsy. Nodule
1 has enlarged and nodule 2 is stable

Nodule 3 has enlarged since the prior study and meets criteria for
annual follow-up.

Nodule 4 is stable and continues to meet criteria for fine needle
aspiration biopsy.

Bilateral dominant lower pole nodules have been previously biopsied.
Correlation with prior biopsy results is recommended.

The above is in keeping with the ACR TI-RADS recommendations - [HOSPITAL] [id number];[DATE].
